# Patient Record
Sex: Female | Born: 1995 | Race: White | Hispanic: No | Marital: Single | Smoking: Never smoker
Health system: Southern US, Community
[De-identification: ages and names within clinical notes are randomized; demographics above are authoritative.]

---
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
## 2017-02-17 ENCOUNTER — Emergency Department
Admission: EM | Admit: 2017-02-17 | Discharge: 2017-02-17 | Disposition: A | Discharge status: Home / Self Care | Payer: No Typology Code available for payment source | Attending: Emergency Medicine | Admitting: Emergency Medicine

## 2017-02-17 ENCOUNTER — Encounter: Payer: Self-pay | Admitting: Emergency Medicine

## 2017-02-17 ENCOUNTER — Emergency Department: Payer: No Typology Code available for payment source

## 2017-02-17 DIAGNOSIS — R079 Chest pain, unspecified: Secondary | ICD-10-CM

## 2017-02-17 DIAGNOSIS — Z79899 Other long term (current) drug therapy: Secondary | ICD-10-CM | POA: Diagnosis not present

## 2017-02-17 DIAGNOSIS — R091 Pleurisy: Secondary | ICD-10-CM | POA: Diagnosis not present

## 2017-02-17 DIAGNOSIS — R0789 Other chest pain: Secondary | ICD-10-CM | POA: Insufficient documentation

## 2017-02-17 LAB — BASIC METABOLIC PANEL
ANION GAP: 8 (ref 5–15)
BUN: 7 mg/dL (ref 6–20)
CHLORIDE: 106 mmol/L (ref 101–111)
CO2: 23 mmol/L (ref 22–32)
Calcium: 9.4 mg/dL (ref 8.9–10.3)
Creatinine, Ser: 0.83 mg/dL (ref 0.44–1.00)
GFR calc non Af Amer: >60 mL/min (ref >60)
GLUCOSE: 103 mg/dL — AB (ref 65–99)
POTASSIUM: 3.9 mmol/L (ref 3.5–5.1)
Sodium: 137 mmol/L (ref 135–145)

## 2017-02-17 LAB — CBC
HEMATOCRIT: 43.5 % (ref 35.0–47.0)
HEMOGLOBIN: 14.9 g/dL (ref 12.0–16.0)
MCH: 30.6 pg (ref 26.0–34.0)
MCHC: 34.4 g/dL (ref 32.0–36.0)
MCV: 88.9 fL (ref 80.0–100.0)
Platelets: 355 10*3/uL (ref 150–440)
RBC: 4.89 MIL/uL (ref 3.80–5.20)
RDW: 12.4 % (ref 11.5–14.5)
WBC: 6.7 10*3/uL (ref 3.6–11.0)

## 2017-02-17 LAB — FIBRIN DERIVATIVES D-DIMER (ARMC ONLY): Fibrin derivatives D-dimer (ARMC): 183.36 ng/mL (FEU) (ref 0.00–499.00)

## 2017-02-17 LAB — TROPONIN I

## 2017-02-17 LAB — POCT PREGNANCY, URINE: Preg Test, Ur: NEGATIVE

## 2017-02-17 MED ORDER — NAPROXEN 500 MG PO TABS: 500.0000 mg | ORAL_TABLET | Freq: Two times a day (BID) | ORAL | 0 refills | Status: AC

## 2017-02-17 MED ORDER — KETOROLAC TROMETHAMINE 30 MG/ML IJ SOLN
INTRAMUSCULAR | Status: AC
  Filled 2017-02-17: qty 1

## 2017-02-17 MED ORDER — KETOROLAC TROMETHAMINE 30 MG/ML IJ SOLN
30.0000 mg | Freq: Once | INTRAMUSCULAR | Status: AC
  Administered 2017-02-17: 30 mg via INTRAVENOUS

## 2017-02-17 NOTE — ED Notes (Signed)
Pt reports that she was prescribed azithromycin last week, feeling better. Developed pain under left breast and left rib cage Sunday. Worse with deep inspiration. Hurts to twist to left per patient. Pt alert and oriented X4, active, cooperative, pt in NAD. RR even and unlabored, color WNL.

## 2017-02-17 NOTE — ED Provider Notes (Signed)
[facility] Emergency Department Provider Note   ____________________________________________   I have reviewed the triage vital signs and the nursing notes.   HISTORY  Chief Complaint Chest Pain   History limited by: Not Limited   HPI Sophia Lambert is a 21 y.o. female who presents to the emergency department today because of chest pain.   LOCATION:left chest DURATION:5 days TIMING: constant worsening SEVERITY: severe QUALITY: sharp CONTEXT: patient states she was treated for upper respiratory infection with z-pac recently. Started having pain 5 days ago. Did go to [place] and back roughly 2 weeks ago. Patient is on birth control. MODIFYING FACTORS: worse with deep breaths and movement ASSOCIATED SYMPTOMS: denies any fevers. No bloody cough. No leg swelling.  Per medical record review no pertinent history.   History reviewed. No pertinent past medical history.  There are no active problems to display for this patient.   No past surgical history on file.  Prior to Admission medications   Medication Sig Start Date End Date Taking? Authorizing Provider  famotidine (PEPCID) 20 MG tablet Take 1 tablet (20 mg total) by mouth 2 (two) times daily. 12/16/16   Irean HongSung, Jade J, MD  predniSONE (DELTASONE) 20 MG tablet 3 tablets daily x 4 days 12/16/16   Irean HongSung, Jade J, MD    Allergies Penicillins  History reviewed. No pertinent family history.  Social History No smoking Occasional alcohol  Review of Systems Constitutional: No fever/chills Eyes: No visual changes. ENT: No sore throat. Cardiovascular: Positive for chest pain. Respiratory: Positive for shortness of breath Gastrointestinal: No abdominal pain.  No nausea, no vomiting.  No diarrhea.   Genitourinary: Negative for dysuria. Musculoskeletal: Negative for back pain. Skin: Negative for rash. Neurological: Negative for headaches, focal weakness or  numbness.  ____________________________________________   PHYSICAL EXAM:  VITAL SIGNS: ED Triage Vitals  Enc Vitals Group     BP 02/17/17 1519 114/88     Pulse Rate 02/17/17 1519 92     Resp 02/17/17 1519 18     Temp 02/17/17 1519 98.8 F (37.1 C)     Temp Source 02/17/17 1519 Oral     SpO2 02/17/17 1519 100 %     Weight 02/17/17 1519 140 lb (63.5 kg)     Height 02/17/17 1519 5\' 5"  (1.651 m)     Head Circumference --      Peak Flow --      Pain Score 02/17/17 1517 6     Pain Loc --     Constitutional: Alert and oriented. Well appearing and in no distress. Eyes: Conjunctivae are normal.  ENT   Head: Normocephalic and atraumatic.   Nose: No congestion/rhinnorhea.   Mouth/Throat: Mucous membranes are moist.   Neck: No stridor. Hematological/Lymphatic/Immunilogical: No cervical lymphadenopathy. Cardiovascular: Normal rate, regular rhythm.  No murmurs, rubs, or gallops. Respiratory: Normal respiratory effort without tachypnea nor retractions. Breath sounds are clear and equal bilaterally. No wheezes/rales/rhonchi. Gastrointestinal: Soft and non tender. No rebound. No guarding.  Genitourinary: Deferred Musculoskeletal: Normal range of motion in all extremities. No lower extremity edema. Neurologic:  Normal speech and language. No gross focal neurologic deficits are appreciated.  Skin:  Skin is warm, dry and intact. No rash noted. Psychiatric: Mood and affect are normal. Speech and behavior are normal. Patient exhibits appropriate insight and judgment.  ____________________________________________    LABS (pertinent positives/negatives)  POCT negative Trop <0.03 CBC wnl BMP glu 103  ____________________________________________   EKG  I, Sophia Lambert, attending physician, personally viewed and  interpreted this EKG  EKG Time: 1522 Rate: 89 Rhythm: normal sinus rhythm with arrythmia Axis: normal Intervals: qtc 433 QRS: narrow ST changes: no st  elevation Impression: normal ekg   ____________________________________________    RADIOLOGY  CXR No active disease  ____________________________________________   PROCEDURES  Procedures  ____________________________________________   INITIAL IMPRESSION / ASSESSMENT AND PLAN / ED COURSE  Pertinent labs & imaging results that were available during my care of the patient were reviewed by me and considered in my medical decision making (see chart for details).  Differential diagnosis includes, but is not limited to, ACS, aortic dissection, pulmonary embolism, cardiac tamponade, pneumothorax, pneumonia, pericarditis/myocarditis, GI-related causes including esophagitis/gastritis, and musculoskeletal chest wall pain.   Patient had a negative troponin, d-dimer.  At this point I doubt ACS and pulmonary embolism.  Negative chest x-ray, I doubt pneumonia, pneumothorax.  No concerning elevation of white blood cell count in the blood work.  Electrolytes all within normal range except for glucose.  Patient stated she did feel somewhat better after Toradol.  At this point I think pleurisy likely.  Discussed this with the patient.  Additionally discussed results and return precautions.   ____________________________________________   FINAL CLINICAL IMPRESSION(S) / ED DIAGNOSES  Final diagnoses:  Nonspecific chest pain  Pleurisy     Note: This dictation was prepared with Dragon dictation. Any transcriptional errors that result from this process are unintentional     Sophia Semen, MD 02/17/17 1736

## 2017-02-17 NOTE — ED Triage Notes (Signed)
Pt from school with left-sided chest/ribcage pain. Pt states it hurts worse when she breathes in and that she has recently had a cough, for which she took z-pack. She also reports that it hurt when she was driving over here and had to turn corners, etc. Pt alert, oriented, tearful during triage.

## 2017-02-17 NOTE — ED Notes (Signed)
ED Provider at bedside. 

## 2017-02-17 NOTE — ED Notes (Signed)
Pt alert and oriented X4, active, cooperative, pt in NAD. RR even and unlabored, color WNL.  Pt informed to return if any life threatening symptoms occur.  Discharge and followup instructions reviewed.  

## 2017-02-17 NOTE — Discharge Instructions (Signed)
Please seek medical attention for any high fevers, chest pain, shortness of breath, change in behavior, persistent vomiting, bloody stool or any other new or concerning symptoms.  

## 2018-05-29 IMAGING — CR DG CHEST 2V
2 series · 2 of 2 positions shown · non-contrast
Comparison: None.

CLINICAL DATA: Cough and congestion for 2 weeks.

EXAM:
CHEST  2 VIEW

[chest pa]
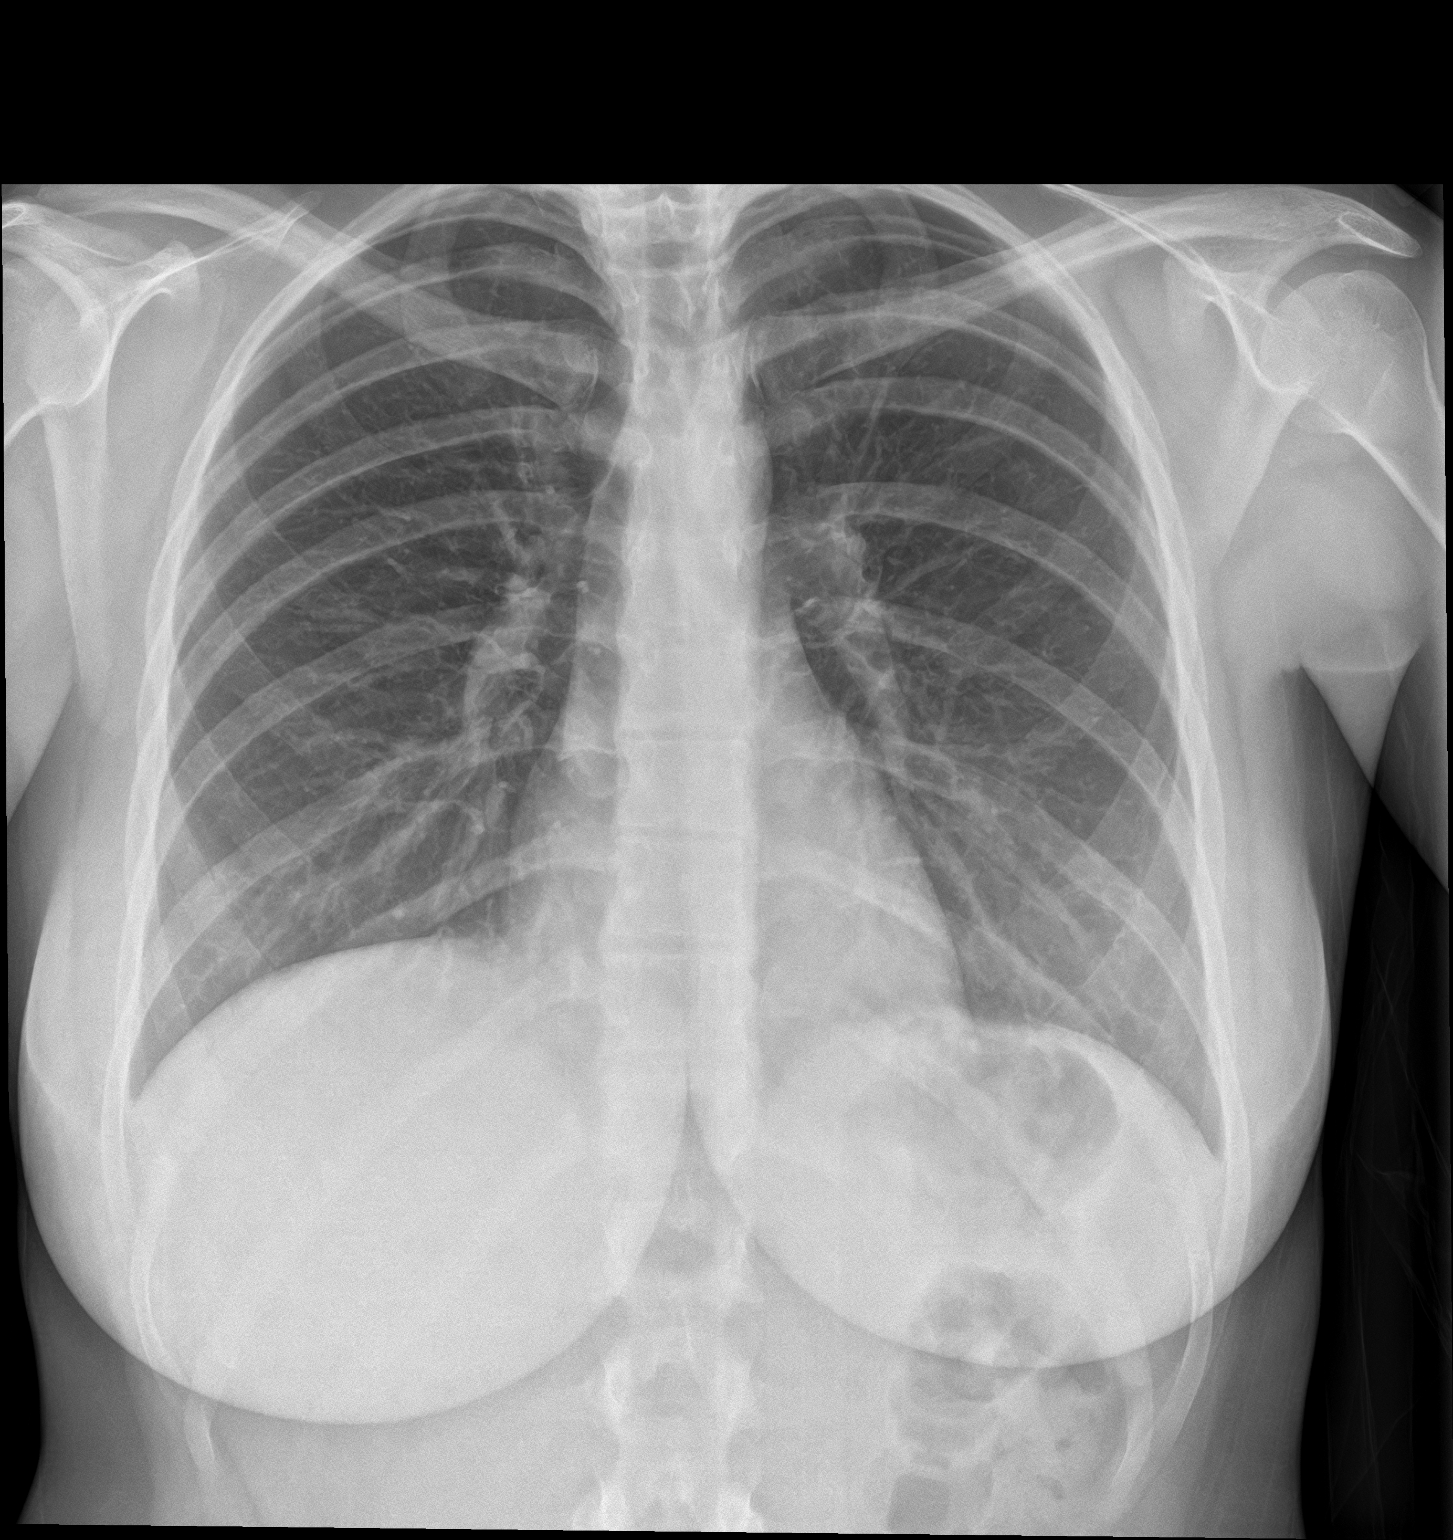

[chest lat]
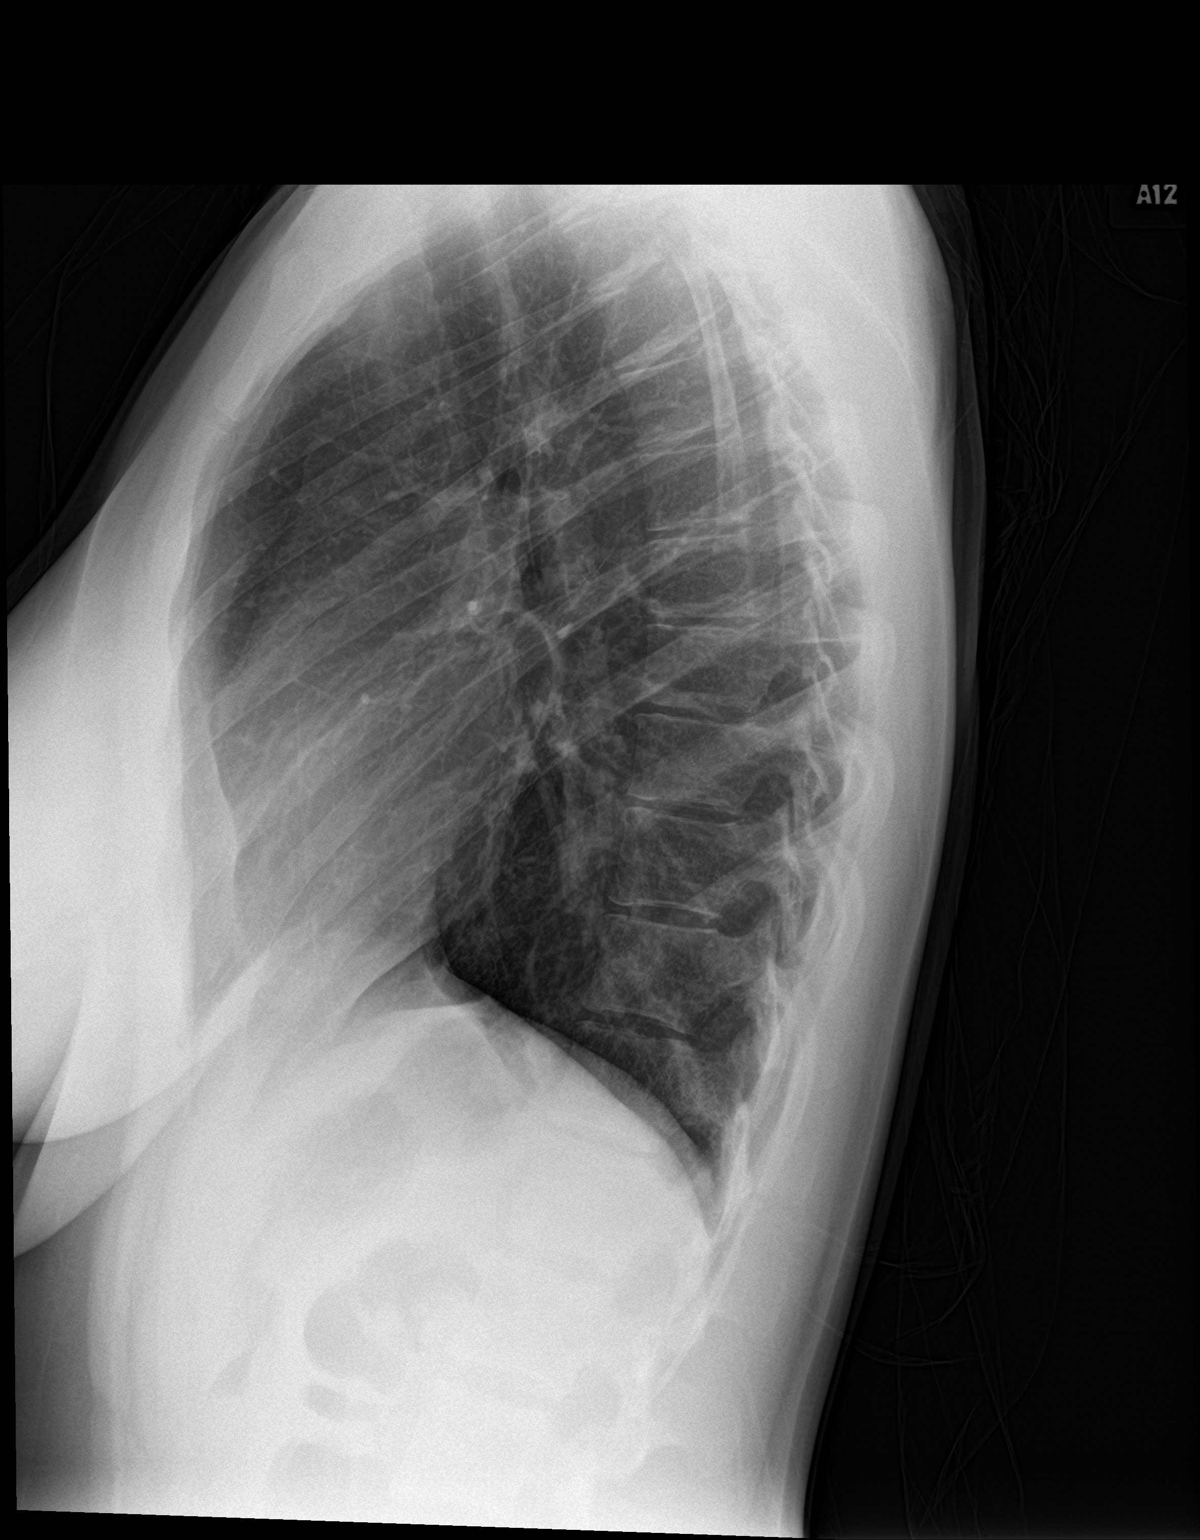

[2 of 2 positions shown; findings below may reference images not displayed]

FINDINGS: The heart size and mediastinal contours are within normal limits.
Both lungs are clear. The visualized skeletal structures are
unremarkable.
IMPRESSION: No active cardiopulmonary disease.

## 2019-02-07 ENCOUNTER — Other Ambulatory Visit: Payer: Self-pay

## 2019-02-07 DIAGNOSIS — Z20822 Contact with and (suspected) exposure to covid-19: Secondary | ICD-10-CM

## 2019-02-09 LAB — NOVEL CORONAVIRUS, NAA: SARS-CoV-2, NAA: NOT DETECTED

## 2019-02-09 LAB — SPECIMEN STATUS REPORT

## 2019-02-15 NOTE — Progress Notes (Signed)
No period since July STI testing? Decline Pap? Yes Flu injection today

## 2019-02-16 ENCOUNTER — Other Ambulatory Visit: Payer: Self-pay

## 2019-02-16 ENCOUNTER — Encounter: Payer: Self-pay | Admitting: Advanced Practice Midwife

## 2019-02-16 ENCOUNTER — Ambulatory Visit (INDEPENDENT_AMBULATORY_CARE_PROVIDER_SITE_OTHER): Payer: PRIVATE HEALTH INSURANCE | Admitting: Advanced Practice Midwife

## 2019-02-16 VITALS — BP 105/70 | HR 71 | Wt 143.8 lb

## 2019-02-16 DIAGNOSIS — Z01419 Encounter for gynecological examination (general) (routine) without abnormal findings: Secondary | ICD-10-CM | POA: Diagnosis not present

## 2019-02-16 DIAGNOSIS — Z3202 Encounter for pregnancy test, result negative: Secondary | ICD-10-CM | POA: Diagnosis not present

## 2019-02-16 DIAGNOSIS — N912 Amenorrhea, unspecified: Secondary | ICD-10-CM

## 2019-02-16 DIAGNOSIS — R1030 Lower abdominal pain, unspecified: Secondary | ICD-10-CM

## 2019-02-16 DIAGNOSIS — Z1151 Encounter for screening for human papillomavirus (HPV): Secondary | ICD-10-CM

## 2019-02-16 DIAGNOSIS — Z23 Encounter for immunization: Secondary | ICD-10-CM | POA: Diagnosis not present

## 2019-02-16 DIAGNOSIS — Z124 Encounter for screening for malignant neoplasm of cervix: Secondary | ICD-10-CM | POA: Diagnosis not present

## 2019-02-16 LAB — POCT URINE PREGNANCY: Preg Test, Ur: NEGATIVE

## 2019-02-16 NOTE — Progress Notes (Signed)
GYNECOLOGY ANNUAL PREVENTATIVE CARE ENCOUNTER NOTE  History:     Sophia Lambert is a 23 y.o. G0P0 female here for a routine annual gynecologic exam.  Current complaints: Patient states she has not had a menstrual cycle since July 2020. She is unsure about vaginal spotting. She endorses 7 year history of Junel OCP for contraception without interruption. Denies abnormal vaginal bleeding, discharge, pelvic pain, problems with intercourse or other gynecologic concerns.    Non-smoker. Endorses penetrative sex with female partners. 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 She declines STI screening today.  Gynecologic History Patient's last menstrual period was 10/20/2018. Contraception: OCP (estrogen/progesterone) Last Pap: None.   Obstetric History OB History  No obstetric history on file.    No past medical history on file.  Current Outpatient Medications on File Prior to Visit  Medication Sig Dispense Refill  . EPINEPHrine 0.3 mg/0.3 mL IJ SOAJ injection EPINEPHrine 0.3 MG/0.3ML Injection Solution Auto-injector  Refills: 0             M.D.       Active    . norethindrone-ethinyl estradiol (LOESTRIN FE) 1-20 MG-MCG tablet Take 1 tablet by mouth daily.    . Adapalene 0.3 % gel Apply pea sized amount to entire face nightly as tolerated.    . famotidine (PEPCID) 20 MG tablet Take 1 tablet (20 mg total) by mouth 2 (two) times daily. (Patient not taking: Reported on 02/16/2019) 8 tablet 0  . predniSONE (DELTASONE) 20 MG tablet 3 tablets daily x 4 days (Patient not taking: Reported on 02/16/2019) 12 tablet 0   No current facility-administered medications on file prior to visit.     Allergies  Allergen Reactions  . Penicillin G Other (See Comments)    Unsure, childhood allergy  . Penicillins Other (See Comments)    Social History:  reports that she has never smoked. She has never used smokeless tobacco. She reports current  alcohol use. She reports that she does not use drugs.  No family history on file.  The following portions of the patient's history were reviewed and updated as appropriate: allergies, current medications, past family history, past medical history, past social history, past surgical history and problem list.  Review of Systems Pertinent items noted in HPI and remainder of comprehensive ROS otherwise negative.  Physical Exam:  BP 105/70   Pulse 71   Wt 143 lb 12.8 oz (65.2 kg)   LMP 10/20/2018   BMI 23.93 kg/m  CONSTITUTIONAL: Well-developed, well-nourished female in no acute distress.  HENT:  Normocephalic, atraumatic, External right and left ear normal. Oropharynx is clear and moist EYES: Conjunctivae and EOM are normal. Pupils are equal, round, and reactive to light. No scleral icterus.  NECK: Normal range of motion, supple, no masses.  Normal thyroid.  SKIN: Skin is warm and dry. No rash noted. Not diaphoretic. No erythema. No pallor. MUSCULOSKELETAL: Normal range of motion. No tenderness.  No cyanosis, clubbing, or edema.  2+ distal pulses. NEUROLOGIC: Alert and oriented to person, place, and time. Normal reflexes, muscle tone coordination. No cranial nerve deficit noted. PSYCHIATRIC: Normal mood and affect. Normal behavior. Normal judgment and thought content. CARDIOVASCULAR: Normal heart rate noted, regular rhythm RESPIRATORY: Clear to auscultation bilaterally. Effort and breath sounds normal, no problems with respiration noted. BREASTS: Symmetric in size. No masses, skin changes, nipple drainage, or lymphadenopathy. ABDOMEN: Soft, normal bowel sounds, no distention noted.  No tenderness, rebound or  guarding.  PELVIC: Normal appearing external genitalia; normal appearing vaginal mucosa and cervix.  No abnormal discharge noted.  Pap smear obtained.  Normal uterine size, no other palpable masses, no uterine or adnexal tenderness.   Assessment and Plan:    1. Encounter for well woman  exam with routine gynecological exam - No concerning findings on physical exam - POCT urine pregnancy - Cytology - PAP( [place])  2. Amenorrhea, unspecified - Encouraged patient to reconsider full STI testing  - Discussed spectrum of workup for amenorrhea . Pt declines change in OCP. Declines lab work. Cautioned about 100% condom use particularly if she is planning to d/c Junel to induce menstrual cycle  RTC PRN for further investigation of amenorrhea Will follow up results of pap smear and manage accordingly. Routine preventative health maintenance measures emphasized. Please refer to After Visit Summary for other counseling recommendations.      Total visit time 30 minutes. Greater than 50% of visit spent in counseling and coordination or care  Mallie Snooks, MSN, CNM Certified Nurse Midwife, [facility] for [employer], [place] 02/16/19 10:33 AM

## 2019-02-22 LAB — CYTOLOGY - PAP
Comment: NEGATIVE
Diagnosis: NEGATIVE
High risk HPV: NEGATIVE

## 2019-07-14 ENCOUNTER — Ambulatory Visit: Payer: PRIVATE HEALTH INSURANCE | Attending: Family

## 2019-07-14 DIAGNOSIS — Z23 Encounter for immunization: Secondary | ICD-10-CM

## 2019-07-14 NOTE — Progress Notes (Signed)
   Covid-19 Vaccination Clinic  Name:  Sophia Lambert    MRN: [id number] DOB: [birth year]  07/14/2019  [name] was observed post Covid-19 immunization for 15 minutes without incident. She was provided with Vaccine Information Sheet and instruction to access the V-Safe system.   [name] was instructed to call 911 with any severe reactions post vaccine: Marland Kitchen Difficulty breathing  . Swelling of face and throat  . A fast heartbeat  . A bad rash all over body  . Dizziness and weakness   Immunizations Administered    Name Date Dose VIS Date Route   Moderna COVID-19 Vaccine 07/14/2019 11:01 AM 0.5 mL 03/22/2019 Intramuscular   Manufacturer: Moderna   Lot: [id number]   NDC: 87867-672-09

## 2019-08-16 ENCOUNTER — Ambulatory Visit: Payer: PRIVATE HEALTH INSURANCE

## 2019-08-23 ENCOUNTER — Ambulatory Visit: Payer: PRIVATE HEALTH INSURANCE | Attending: Family

## 2019-08-23 DIAGNOSIS — Z23 Encounter for immunization: Secondary | ICD-10-CM

## 2019-08-23 NOTE — Progress Notes (Signed)
   Covid-19 Vaccination Clinic  Name:  Conswella Bruney    MRN: [id number] DOB: 12/05/1995  08/23/2019  Ms. Campus was observed post Covid-19 immunization for 15 minutes without incident. She was provided with Vaccine Information Sheet and instruction to access the V-Safe system.   Ms. Rupert was instructed to call 911 with any severe reactions post vaccine: Marland Kitchen Difficulty breathing  . Swelling of face and throat  . A fast heartbeat  . A bad rash all over body  . Dizziness and weakness   Immunizations Administered    Name Date Dose VIS Date Route   Moderna COVID-19 Vaccine 08/23/2019  2:36 PM 0.5 mL 03/2019 Intramuscular   Manufacturer: Moderna   Lot: [id number]   NDC: 80012-393-59
